# Patient Record
Sex: Male | Born: 1985 | Race: Black or African American | Hispanic: No | Marital: Single | Smoking: Never smoker
Health system: Southern US, Community
[De-identification: ages and names within clinical notes are randomized; demographics above are authoritative.]

---

## 2019-09-25 ENCOUNTER — Other Ambulatory Visit: Payer: Self-pay

## 2019-09-25 ENCOUNTER — Emergency Department: Payer: Self-pay

## 2019-09-25 ENCOUNTER — Encounter: Payer: Self-pay | Admitting: Emergency Medicine

## 2019-09-25 ENCOUNTER — Emergency Department
Admission: EM | Admit: 2019-09-25 | Discharge: 2019-09-25 | Disposition: A | Discharge status: Home / Self Care | Payer: Self-pay | Attending: Student in an Organized Health Care Education/Training Program | Admitting: Student in an Organized Health Care Education/Training Program

## 2019-09-25 DIAGNOSIS — R197 Diarrhea, unspecified: Secondary | ICD-10-CM | POA: Insufficient documentation

## 2019-09-25 DIAGNOSIS — R112 Nausea with vomiting, unspecified: Secondary | ICD-10-CM | POA: Insufficient documentation

## 2019-09-25 LAB — URINE DRUG SCREEN, QUALITATIVE (ARMC ONLY)
Amphetamines, Ur Screen: NOT DETECTED
Barbiturates, Ur Screen: NOT DETECTED
Benzodiazepine, Ur Scrn: NOT DETECTED
Cannabinoid 50 Ng, Ur ~~LOC~~: NOT DETECTED
Cocaine Metabolite,Ur ~~LOC~~: NOT DETECTED
MDMA (Ecstasy)Ur Screen: NOT DETECTED
Methadone Scn, Ur: NOT DETECTED
Opiate, Ur Screen: NOT DETECTED
Phencyclidine (PCP) Ur S: NOT DETECTED
Tricyclic, Ur Screen: NOT DETECTED

## 2019-09-25 LAB — COMPREHENSIVE METABOLIC PANEL
ALT: 15 U/L (ref 0–44)
AST: 25 U/L (ref 15–41)
Albumin: 5.4 g/dL — ABNORMAL HIGH (ref 3.5–5.0)
Alkaline Phosphatase: 77 U/L (ref 38–126)
Anion gap: 15 (ref 5–15)
BUN: 16 mg/dL (ref 6–20)
CO2: 23 mmol/L (ref 22–32)
Calcium: 10.2 mg/dL (ref 8.9–10.3)
Chloride: 101 mmol/L (ref 98–111)
Creatinine, Ser: 1.44 mg/dL — ABNORMAL HIGH (ref 0.61–1.24)
GFR calc Af Amer: >60 mL/min (ref >60)
GFR calc non Af Amer: >60 mL/min (ref >60)
Glucose, Bld: 178 mg/dL — ABNORMAL HIGH (ref 70–99)
Potassium: 3.2 mmol/L — ABNORMAL LOW (ref 3.5–5.1)
Sodium: 139 mmol/L (ref 135–145)
Total Bilirubin: 0.8 mg/dL (ref 0.3–1.2)
Total Protein: 9.4 g/dL — ABNORMAL HIGH (ref 6.5–8.1)

## 2019-09-25 LAB — CBC WITH DIFFERENTIAL/PLATELET
Abs Immature Granulocytes: 0.1 10*3/uL — ABNORMAL HIGH (ref 0.00–0.07)
Basophils Absolute: 0.1 10*3/uL (ref 0.0–0.1)
Basophils Relative: 0 %
Eosinophils Absolute: 0.1 10*3/uL (ref 0.0–0.5)
Eosinophils Relative: 1 %
HCT: 49 % (ref 39.0–52.0)
Hemoglobin: 16.9 g/dL (ref 13.0–17.0)
Immature Granulocytes: 1 %
Lymphocytes Relative: 13 %
Lymphs Abs: 2.2 10*3/uL (ref 0.7–4.0)
MCH: 30.5 pg (ref 26.0–34.0)
MCHC: 34.5 g/dL (ref 30.0–36.0)
MCV: 88.4 fL (ref 80.0–100.0)
Monocytes Absolute: 0.9 10*3/uL (ref 0.1–1.0)
Monocytes Relative: 6 %
Neutro Abs: 13.1 10*3/uL — ABNORMAL HIGH (ref 1.7–7.7)
Neutrophils Relative %: 79 %
Platelets: 307 10*3/uL (ref 150–400)
RBC: 5.54 MIL/uL (ref 4.22–5.81)
RDW: 13.9 % (ref 11.5–15.5)
WBC: 16.5 10*3/uL — ABNORMAL HIGH (ref 4.0–10.5)
nRBC: 0 % (ref 0.0–0.2)

## 2019-09-25 LAB — LIPASE, BLOOD: Lipase: 20 U/L (ref 11–51)

## 2019-09-25 LAB — URINALYSIS, COMPLETE (UACMP) WITH MICROSCOPIC
Bacteria, UA: NONE SEEN
Bilirubin Urine: NEGATIVE
Glucose, UA: NEGATIVE mg/dL
Hgb urine dipstick: NEGATIVE
Ketones, ur: NEGATIVE mg/dL
Leukocytes,Ua: NEGATIVE
Nitrite: NEGATIVE
Protein, ur: 100 mg/dL — AB
Specific Gravity, Urine: >1.046 — ABNORMAL HIGH (ref 1.005–1.030)
pH: 5 (ref 5.0–8.0)

## 2019-09-25 IMAGING — CT CT ABD-PELV W/ CM
2 of 4 series · 16 of 46 positions shown, 18 images · IV contrast (APPLIED)
Comparison: None.

CLINICAL DATA: Lower abdominal pain, primarily right-sided.
Vomiting.

EXAM:
CT ABDOMEN AND PELVIS WITH CONTRAST
TECHNIQUE: Multidetector CT imaging of the abdomen and pelvis was performed
using the standard protocol following bolus administration of
intravenous contrast.
CONTRAST:  100mL OMNIPAQUE IOHEXOL 300 MG/ML  SOLN

[Series 2: routine abd/pel with · axial · 0.68mm/px · z∈[-1105,-695]mm · 13 of 90 slices shown, 15 images]
[im 4/90  soft-tissue]
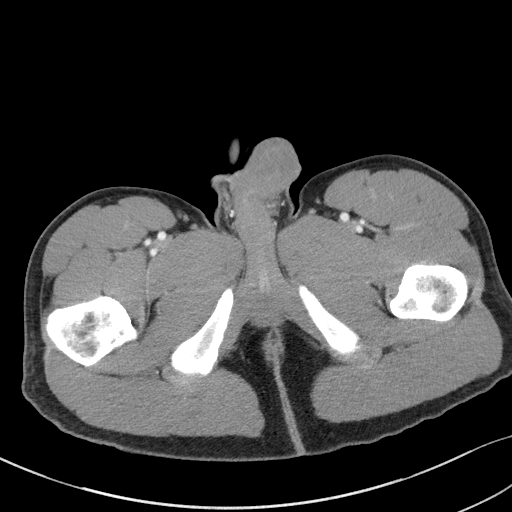
[im 4/90  bone]
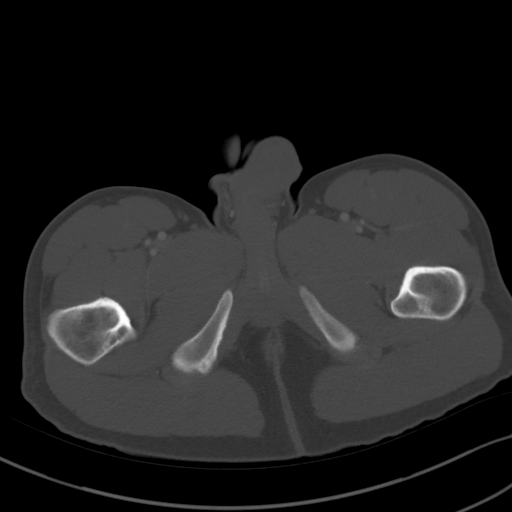
[im 12/90  soft-tissue]
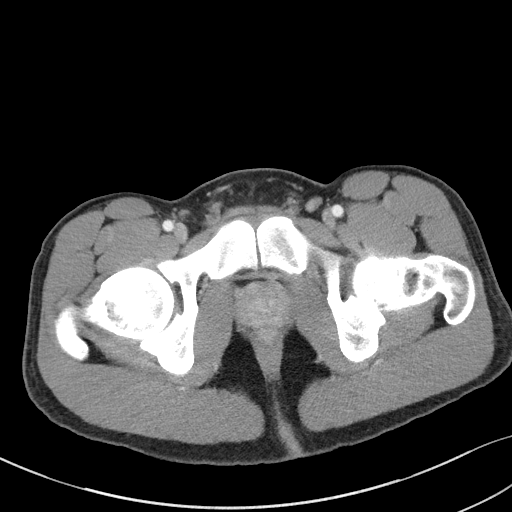
[im 19/90  soft-tissue]
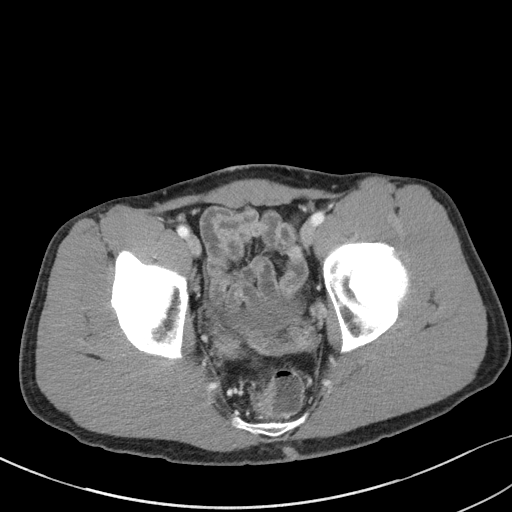
[im 26/90  soft-tissue]
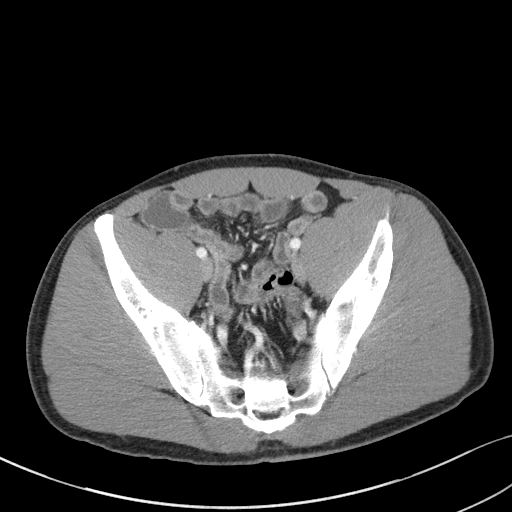
[im 30/90  soft-tissue]
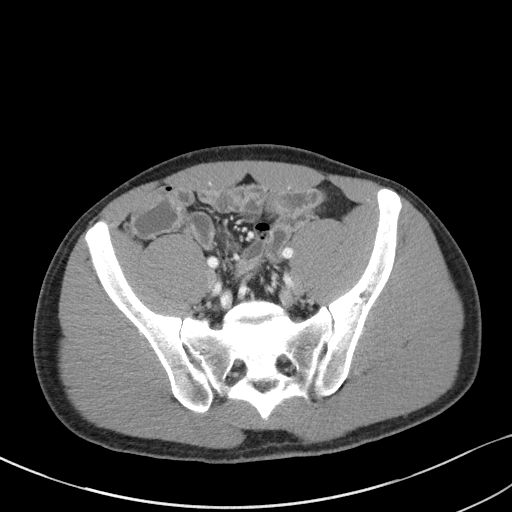
[im 38/90  soft-tissue]
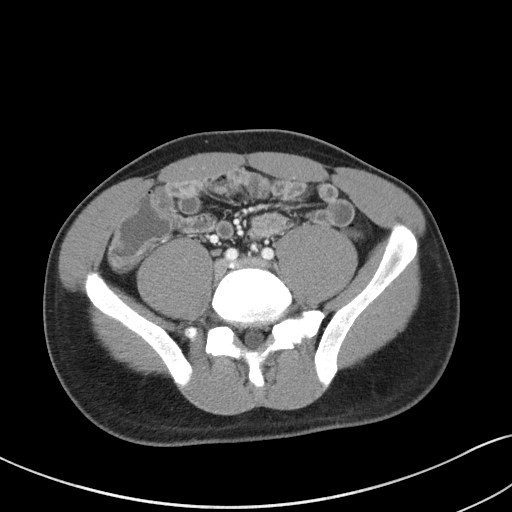
[im 45/90  soft-tissue]
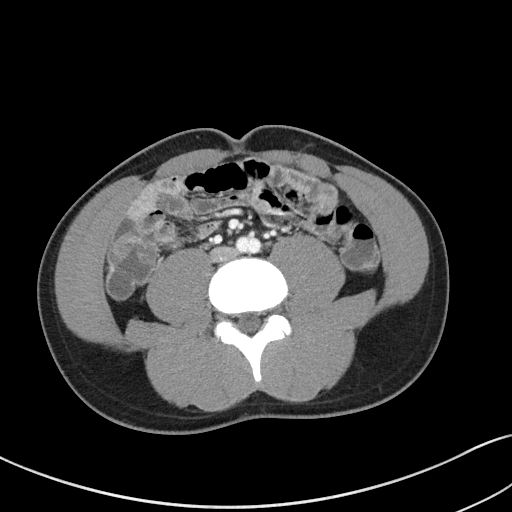
[im 52/90  soft-tissue]
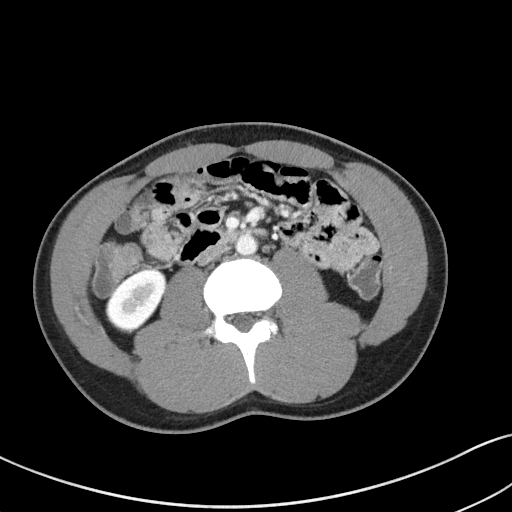
[im 60/90  soft-tissue]
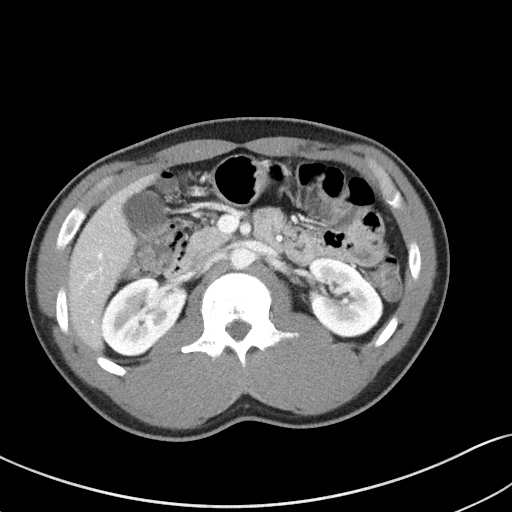
[im 60/90  bone]
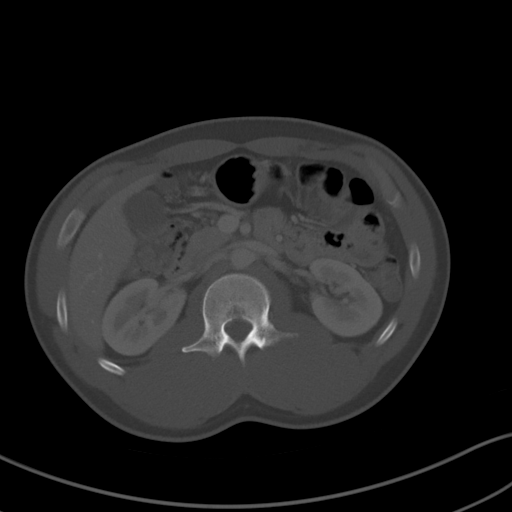
[im 64/90  soft-tissue]
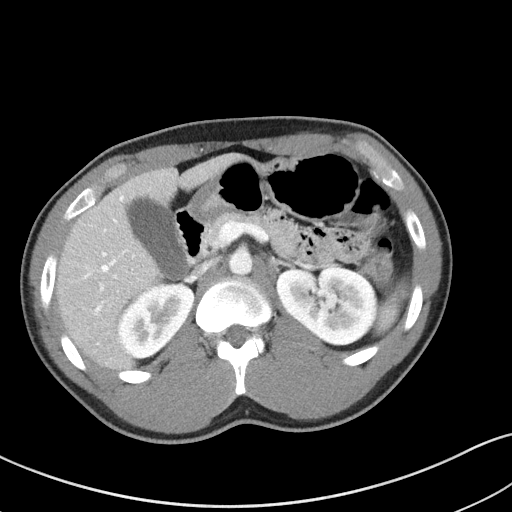
[im 71/90  soft-tissue]
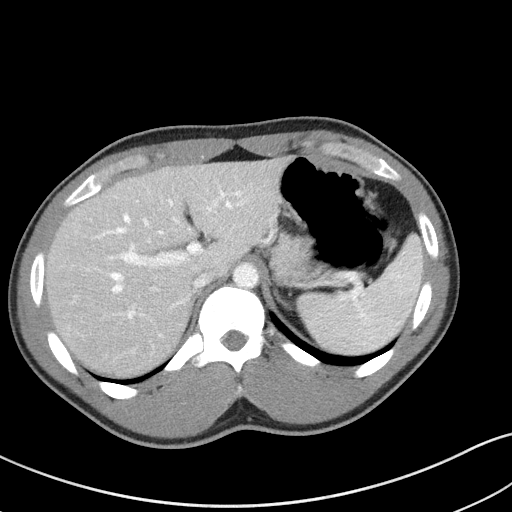
[im 78/90  soft-tissue]
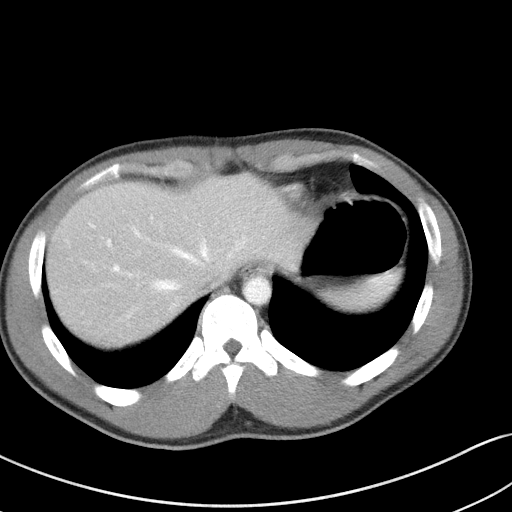
[im 86/90  soft-tissue]
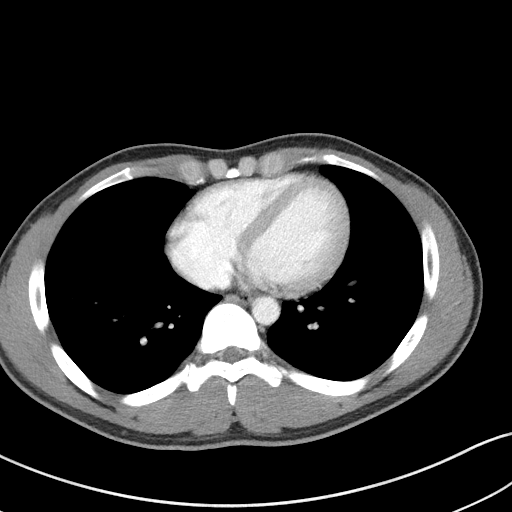

[Series 5: coronal st · coronal · 0.67mm/px · 3 of 80 slices shown]
[im 27/80  soft-tissue]
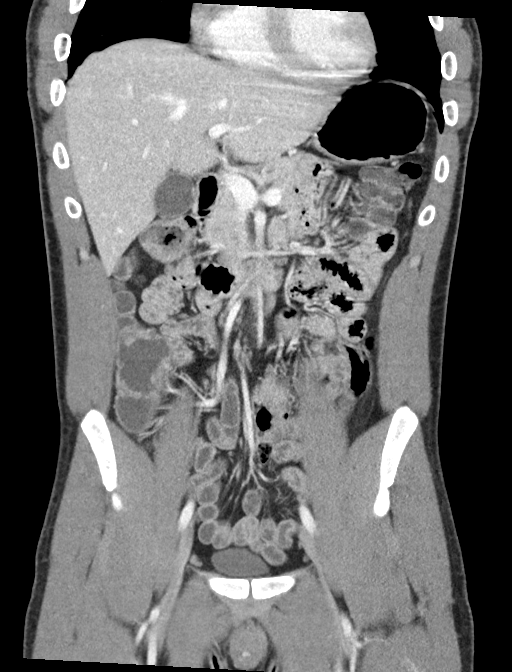
[im 36/80  soft-tissue]
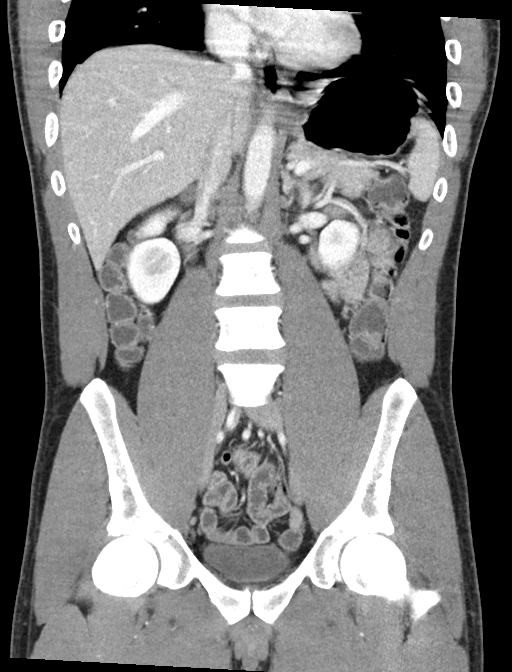
[im 44/80  soft-tissue]
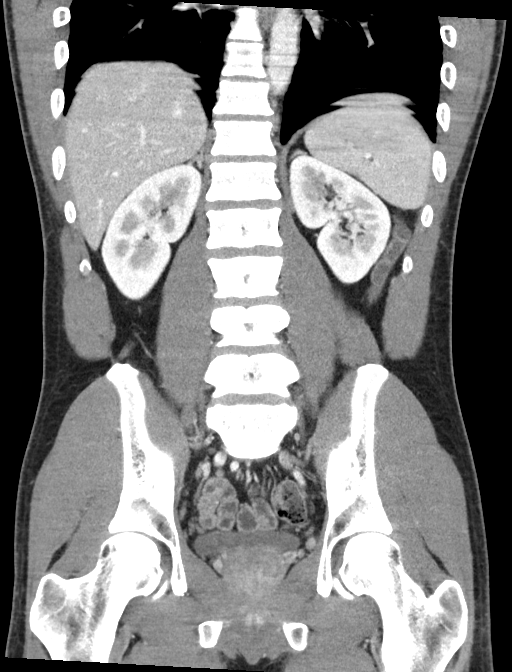

[16 of 46 positions shown; findings below may reference images not displayed]

FINDINGS: Lower chest: There are small apparent bullae in the lung bases. No
lung base edema or airspace opacity.

Hepatobiliary: No focal liver lesions are appreciable. Gallbladder
wall is not appreciably thickened. There is no biliary duct
dilatation.

Pancreas: There is no pancreatic mass or inflammatory focus.

Spleen: No splenic lesions are evident.

Adrenals/Urinary Tract: Adrenals bilaterally appear normal. There is
no evident renal mass or hydronephrosis on either side. There is no
appreciable renal or ureteral calculus on either side. Urinary
bladder is midline with wall thickness within normal limits.

Stomach/Bowel: There is no appreciable bowel wall or mesenteric
thickening. No evident bowel obstruction. The terminal ileum appears
normal. There is no evident free air or portal venous air.

Vascular/Lymphatic: There is no abdominal aortic aneurysm. No
arterial vascular lesions are evident. Major venous structures
appear patent. There is no evident adenopathy in the abdomen or
pelvis.

Reproductive: Prostate and seminal vesicles are normal in size and
contour. No evident pelvic mass.

Other: Appendix appears normal. No abscess or ascites is evident in
the abdomen or pelvis.

Musculoskeletal: No blastic or lytic bone lesions are evident. No
intramuscular or abdominal wall lesions are evident.
IMPRESSION: 1. Normal appearing appendix. No bowel wall thickening or bowel
obstruction. No abscess in the abdomen or pelvis.

2. No evident renal or ureteral calculus. No hydronephrosis. Urinary
bladder wall thickness normal.

## 2019-09-25 MED ORDER — PROMETHAZINE HCL 25 MG/ML IJ SOLN: 25.0000 mg | Freq: Four times a day (QID) | INTRAMUSCULAR | Status: DC | PRN

## 2019-09-25 MED ORDER — ONDANSETRON 4 MG PO TBDP: 4.0000 mg | ORAL_TABLET | Freq: Once | ORAL | Status: DC

## 2019-09-25 MED ORDER — SODIUM CHLORIDE 0.9 % IV BOLUS
1000.0000 mL | Freq: Once | INTRAVENOUS | Status: AC
  Administered 2019-09-25: 1000 mL via INTRAVENOUS

## 2019-09-25 MED ORDER — ONDANSETRON 4 MG PO TBDP
4.0000 mg | ORAL_TABLET | Freq: Once | ORAL | Status: AC
  Administered 2019-09-25: 4 mg via ORAL
  Filled 2019-09-25: qty 1

## 2019-09-25 MED ORDER — IOHEXOL 300 MG/ML  SOLN
100.0000 mL | Freq: Once | INTRAMUSCULAR | Status: AC | PRN
  Administered 2019-09-25: 100 mL via INTRAVENOUS

## 2019-09-25 MED ORDER — ONDANSETRON 4 MG PO TBDP: 4.0000 mg | ORAL_TABLET | Freq: Three times a day (TID) | ORAL | 0 refills | Status: AC | PRN

## 2019-09-25 NOTE — ED Triage Notes (Signed)
Pt to triage via w/c with no distress noted, brought in by EMS; pt reports N/V since last night with some left lower abd pain

## 2019-09-25 NOTE — ED Notes (Signed)
Pt reports able to keep down ice water with no vomiting

## 2019-09-25 NOTE — ED Notes (Signed)
Pt given ice water for PO challenge.

## 2019-09-25 NOTE — ED Notes (Signed)
Pt reports unable to provide urine sample at this time

## 2019-09-25 NOTE — ED Provider Notes (Signed)
[facility] Emergency Department Provider Note    First MD Initiated Contact with Patient 09/25/19 [phone]     (approximate)  I have reviewed the triage vital signs and the nursing notes.   HISTORY  Chief Complaint Emesis    HPI Barry Dixon is a 34 y.o. male below listed past medical history presents to the ER for evaluation of epigastric pain nausea and vomiting.  Patient concerned that he might have eaten some spoiled food that had been sitting out for several days.  Did have 2 episodes of nonbloody nonmelanotic diarrhea.  His emesis is nonbilious and nonbloody.  States that he got some food poisoning but is describing crampy abdominal pain.  No previous abdominal surgeries.  Has not been able to keep anything down for the past 24 hours    History reviewed. No pertinent past medical history. No family history on file. History reviewed. No pertinent surgical history. There are no problems to display for this patient.     Prior to Admission medications   Medication Sig Start Date End Date Taking? Authorizing Provider  ondansetron (ZOFRAN ODT) 4 MG disintegrating tablet Take 1 tablet (4 mg total) by mouth every 8 (eight) hours as needed for nausea or vomiting. 09/25/19   Willy Eddy, MD    Allergies Patient has no known allergies.    Social History Social History   Tobacco Use  . Smoking status: Never Smoker  . Smokeless tobacco: Never Used  Substance Use Topics  . Alcohol use: Not on file  . Drug use: Not on file    Review of Systems Patient denies headaches, rhinorrhea, blurry vision, numbness, shortness of breath, chest pain, edema, cough, abdominal pain, nausea, vomiting, diarrhea, dysuria, fevers, rashes or hallucinations unless otherwise stated above in HPI. ____________________________________________   PHYSICAL EXAM:  VITAL SIGNS: Vitals:   09/25/19 1000 09/25/19 1030  BP: 126/68 (!) 110/47  Pulse:    Resp: 16   Temp:     SpO2:      Constitutional: Alert and oriented.  Eyes: Conjunctivae are normal.  Head: Atraumatic. Nose: No congestion/rhinnorhea. Mouth/Throat: Mucous membranes are moist.   Neck: No stridor. Painless ROM.  Cardiovascular: Normal rate, regular rhythm. Grossly normal heart sounds.  Good peripheral circulation. Respiratory: Normal respiratory effort.  No retractions. Lungs CTAB. Gastrointestinal: Soft without guarding but diffusely ttp. No distention. No abdominal bruits. No CVA tenderness. Genitourinary:  Musculoskeletal: No lower extremity tenderness nor edema.  No joint effusions. Neurologic:  Normal speech and language. No gross focal neurologic deficits are appreciated. No facial droop Skin:  Skin is warm, dry and intact. No rash noted. Psychiatric: Mood and affect are normal. Speech and behavior are normal.  ____________________________________________   LABS (all labs ordered are listed, but only abnormal results are displayed)  Results for orders placed or performed during the hospital encounter of 09/25/19 (from the past 24 hour(s))  Urinalysis, Complete w Microscopic     Status: Abnormal   Collection Time: 09/25/19  5:58 AM  Result Value Ref Range   Color, Urine AMBER (A) YELLOW   APPearance HAZY (A) CLEAR   Specific Gravity, Urine >1.046 (H) 1.005 - 1.030   pH 5.0 5.0 - 8.0   Glucose, UA NEGATIVE NEGATIVE mg/dL   Hgb urine dipstick NEGATIVE NEGATIVE   Bilirubin Urine NEGATIVE NEGATIVE   Ketones, ur NEGATIVE NEGATIVE mg/dL   Protein, ur 962 (A) NEGATIVE mg/dL   Nitrite NEGATIVE NEGATIVE   Leukocytes,Ua NEGATIVE NEGATIVE   RBC /  HPF 6-10 0 - 5 RBC/hpf   WBC, UA 6-10 0 - 5 WBC/hpf   Bacteria, UA NONE SEEN NONE SEEN   Squamous Epithelial / LPF 0-5 0 - 5   Mucus PRESENT    Hyaline Casts, UA PRESENT   CBC with Differential     Status: Abnormal   Collection Time: 09/25/19  6:02 AM  Result Value Ref Range   WBC 16.5 (H) 4.0 - 10.5 K/uL   RBC 5.54 4.22 - 5.81  MIL/uL   Hemoglobin 16.9 13.0 - 17.0 g/dL   HCT 81.0 39 - 52 %   MCV 88.4 80.0 - 100.0 fL   MCH 30.5 26.0 - 34.0 pg   MCHC 34.5 30.0 - 36.0 g/dL   RDW 17.5 10.2 - 58.5 %   Platelets 307 150 - 400 K/uL   nRBC 0.0 0.0 - 0.2 %   Neutrophils Relative % 79 %   Neutro Abs 13.1 (H) 1.7 - 7.7 K/uL   Lymphocytes Relative 13 %   Lymphs Abs 2.2 0.7 - 4.0 K/uL   Monocytes Relative 6 %   Monocytes Absolute 0.9 0 - 1 K/uL   Eosinophils Relative 1 %   Eosinophils Absolute 0.1 0 - 0 K/uL   Basophils Relative 0 %   Basophils Absolute 0.1 0 - 0 K/uL   Immature Granulocytes 1 %   Abs Immature Granulocytes 0.10 (H) 0.00 - 0.07 K/uL  Comprehensive metabolic panel     Status: Abnormal   Collection Time: 09/25/19  6:02 AM  Result Value Ref Range   Sodium 139 135 - 145 mmol/L   Potassium 3.2 (L) 3.5 - 5.1 mmol/L   Chloride 101 98 - 111 mmol/L   CO2 23 22 - 32 mmol/L   Glucose, Bld 178 (H) 70 - 99 mg/dL   BUN 16 6 - 20 mg/dL   Creatinine, Ser 2.77 (H) 0.61 - 1.24 mg/dL   Calcium 82.4 8.9 - 23.5 mg/dL   Total Protein 9.4 (H) 6.5 - 8.1 g/dL   Albumin 5.4 (H) 3.5 - 5.0 g/dL   AST 25 15 - 41 U/L   ALT 15 0 - 44 U/L   Alkaline Phosphatase 77 38 - 126 U/L   Total Bilirubin 0.8 0.3 - 1.2 mg/dL   GFR calc non Af Amer >60 >60 mL/min   GFR calc Af Amer >60 >60 mL/min   Anion gap 15 5 - 15  Lipase, blood     Status: None   Collection Time: 09/25/19  6:02 AM  Result Value Ref Range   Lipase 20 11 - 51 U/L   ____________________________________________   ____________________________________________  RADIOLOGY  I personally reviewed all radiographic images ordered to evaluate for the above acute complaints and reviewed radiology reports and findings.  These findings were personally discussed with the patient.  Please see medical record for radiology report.  ____________________________________________   PROCEDURES  Procedure(s) performed:  Procedures    Critical Care performed:  no ____________________________________________   INITIAL IMPRESSION / ASSESSMENT AND PLAN / ED COURSE  Pertinent labs & imaging results that were available during my care of the patient were reviewed by me and considered in my medical decision making (see chart for details).   DDX: Enteritis, gastritis, appendicitis, colitis, diverticulitis, foodborne illness, dehydration, electrolyte abnormality  Barry Dixon is a 34 y.o. who presents to the ED with symptoms as described above.  Patient having some mild abdominal pain associated with nausea and vomiting will give IV fluids as well  is antiemetics.  Given his white count and abdominal pain as described above will order CT imaging to exclude surgical process.  Will observe in the ER and reassess.  Clinical Course as of Sep 25 1050  Fri Sep 25, 2019  1047 Patient reassessed.  He is received IV fluids.  He is tolerating p.o.  Feels comfortably no discharge home at this time.  Does not have any signs of sepsis CT imaging is reassuring.  He is appropriate for trial of outpatient management.  Discussed signs and symptoms for which she should return to the ER.   [PR]    Clinical Course User Index [PR] Willy Eddy, MD    The patient was evaluated in Emergency Department today for the symptoms described in the history of present illness. He/she was evaluated in the context of the global COVID-19 pandemic, which necessitated consideration that the patient might be at risk for infection with the SARS-CoV-2 virus that causes COVID-19. Institutional protocols and algorithms that pertain to the evaluation of patients at risk for COVID-19 are in a state of rapid change based on information released by regulatory bodies including the CDC and federal and state organizations. These policies and algorithms were followed during the patient's care in the ED.  As part of my medical decision making, I reviewed the following data within the electronic medical  record:  Nursing notes reviewed and incorporated, Labs reviewed, notes from prior ED visits and [place] Controlled Substance Database   ____________________________________________   FINAL CLINICAL IMPRESSION(S) / ED DIAGNOSES  Final diagnoses:  Nausea vomiting and diarrhea      NEW MEDICATIONS STARTED DURING THIS VISIT:  New Prescriptions   ONDANSETRON (ZOFRAN ODT) 4 MG DISINTEGRATING TABLET    Take 1 tablet (4 mg total) by mouth every 8 (eight) hours as needed for nausea or vomiting.     Note:  This document was prepared using Dragon voice recognition software and may include unintentional dictation errors.    Willy Eddy, MD 09/25/19 1052

## 2019-09-25 NOTE — ED Notes (Signed)
See triage note, pt reports vomiting x50 in the past 24 hours with RLQ abdominal pain. Denies fevers. Reports "some" diarrhea.  Pt in NAD. Alert and oriented. Clear speech

## 2022-04-30 ENCOUNTER — Other Ambulatory Visit (HOSPITAL_COMMUNITY): Payer: Self-pay
